# Patient Record
Sex: Male | Born: 1995 | Race: White | Hispanic: No | Marital: Single
Health system: Southern US, Community
[De-identification: ages and names within clinical notes are randomized; demographics above are authoritative.]

---
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
## 2015-02-19 ENCOUNTER — Emergency Department: Payer: BLUE CROSS/BLUE SHIELD

## 2015-02-19 ENCOUNTER — Emergency Department
Admission: EM | Admit: 2015-02-19 | Discharge: 2015-02-19 | Disposition: A | Discharge status: Home / Self Care | Payer: BLUE CROSS/BLUE SHIELD | Attending: Emergency Medicine | Admitting: Emergency Medicine

## 2015-02-19 DIAGNOSIS — N50811 Right testicular pain: Secondary | ICD-10-CM | POA: Insufficient documentation

## 2015-02-19 DIAGNOSIS — Z79899 Other long term (current) drug therapy: Secondary | ICD-10-CM | POA: Insufficient documentation

## 2015-02-19 DIAGNOSIS — N50819 Testicular pain, unspecified: Secondary | ICD-10-CM

## 2015-02-19 LAB — URINALYSIS COMPLETE WITH MICROSCOPIC (ARMC ONLY)
Bacteria, UA: NONE SEEN
Bilirubin Urine: NEGATIVE
GLUCOSE, UA: NEGATIVE mg/dL
HGB URINE DIPSTICK: NEGATIVE
Ketones, ur: NEGATIVE mg/dL
LEUKOCYTES UA: NEGATIVE
Nitrite: NEGATIVE
PH: 7 (ref 5.0–8.0)
Protein, ur: 30 mg/dL — AB
SPECIFIC GRAVITY, URINE: 1.021 (ref 1.005–1.030)
SQUAMOUS EPITHELIAL / LPF: NONE SEEN

## 2015-02-19 LAB — COMPREHENSIVE METABOLIC PANEL
ALBUMIN: 4.8 g/dL (ref 3.5–5.0)
ALT: 18 U/L (ref 17–63)
ANION GAP: 12 (ref 5–15)
AST: 12 U/L — AB (ref 15–41)
Alkaline Phosphatase: 74 U/L (ref 38–126)
BILIRUBIN TOTAL: 1.1 mg/dL (ref 0.3–1.2)
BUN: 12 mg/dL (ref 6–20)
CHLORIDE: 104 mmol/L (ref 101–111)
CO2: 24 mmol/L (ref 22–32)
Calcium: 9.6 mg/dL (ref 8.9–10.3)
Creatinine, Ser: 1.35 mg/dL — ABNORMAL HIGH (ref 0.61–1.24)
GFR calc Af Amer: >60 mL/min (ref >60)
GFR calc non Af Amer: >60 mL/min (ref >60)
GLUCOSE: 119 mg/dL — AB (ref 65–99)
POTASSIUM: 3.8 mmol/L (ref 3.5–5.1)
SODIUM: 140 mmol/L (ref 135–145)
TOTAL PROTEIN: 7.9 g/dL (ref 6.5–8.1)

## 2015-02-19 LAB — CBC
HCT: 45.5 % (ref 40.0–52.0)
Hemoglobin: 15.5 g/dL (ref 13.0–18.0)
MCH: 33.2 pg (ref 26.0–34.0)
MCHC: 34 g/dL (ref 32.0–36.0)
MCV: 97.6 fL (ref 80.0–100.0)
PLATELETS: 323 10*3/uL (ref 150–440)
RBC: 4.66 MIL/uL (ref 4.40–5.90)
RDW: 13 % (ref 11.5–14.5)
WBC: 11 10*3/uL — AB (ref 3.8–10.6)

## 2015-02-19 MED ORDER — OXYCODONE-ACETAMINOPHEN 5-325 MG PO TABS: 1.0000 | ORAL_TABLET | Freq: Four times a day (QID) | ORAL | Status: AC | PRN

## 2015-02-19 MED ORDER — OXYCODONE-ACETAMINOPHEN 5-325 MG PO TABS
1.0000 | ORAL_TABLET | Freq: Once | ORAL | Status: AC
  Administered 2015-02-19: 1 via ORAL
  Filled 2015-02-19: qty 1

## 2015-02-19 MED ORDER — MORPHINE SULFATE (PF) 4 MG/ML IV SOLN
INTRAVENOUS | Status: AC
  Administered 2015-02-19: 4 mg via INTRAVENOUS
  Filled 2015-02-19: qty 1

## 2015-02-19 MED ORDER — IOHEXOL 240 MG/ML SOLN
25.0000 mL | INTRAMUSCULAR | Status: AC
  Administered 2015-02-19: 50 mL via ORAL

## 2015-02-19 MED ORDER — IBUPROFEN 800 MG PO TABS: 800.0000 mg | ORAL_TABLET | Freq: Three times a day (TID) | ORAL | Status: AC | PRN

## 2015-02-19 MED ORDER — KETOROLAC TROMETHAMINE 30 MG/ML IJ SOLN
30.0000 mg | Freq: Once | INTRAMUSCULAR | Status: AC
  Administered 2015-02-19: 30 mg via INTRAVENOUS
  Filled 2015-02-19: qty 1

## 2015-02-19 MED ORDER — ONDANSETRON HCL 4 MG/2ML IJ SOLN
4.0000 mg | Freq: Once | INTRAMUSCULAR | Status: AC
  Administered 2015-02-19: 4 mg via INTRAVENOUS

## 2015-02-19 MED ORDER — ONDANSETRON HCL 4 MG/2ML IJ SOLN
INTRAMUSCULAR | Status: AC
  Administered 2015-02-19: 4 mg via INTRAVENOUS
  Filled 2015-02-19: qty 2

## 2015-02-19 MED ORDER — HYDROMORPHONE HCL 1 MG/ML IJ SOLN
INTRAMUSCULAR | Status: AC
  Administered 2015-02-19: 1 mg via INTRAVENOUS
  Filled 2015-02-19: qty 1

## 2015-02-19 MED ORDER — HYDROMORPHONE HCL 1 MG/ML IJ SOLN
1.0000 mg | Freq: Once | INTRAMUSCULAR | Status: AC
  Administered 2015-02-19: 1 mg via INTRAVENOUS

## 2015-02-19 MED ORDER — SODIUM CHLORIDE 0.9 % IV BOLUS (SEPSIS)
1000.0000 mL | Freq: Once | INTRAVENOUS | Status: AC
  Administered 2015-02-19: 1000 mL via INTRAVENOUS

## 2015-02-19 MED ORDER — MORPHINE SULFATE (PF) 4 MG/ML IV SOLN
4.0000 mg | Freq: Once | INTRAVENOUS | Status: AC
  Administered 2015-02-19: 4 mg via INTRAVENOUS

## 2015-02-19 MED ORDER — IOHEXOL 350 MG/ML SOLN
85.0000 mL | Freq: Once | INTRAVENOUS | Status: AC | PRN
  Administered 2015-02-19: 85 mL via INTRAVENOUS

## 2015-02-19 NOTE — ED Notes (Signed)
Patient transported to CT 

## 2015-02-19 NOTE — ED Notes (Signed)
Pt verbalized understanding of discharge instructions. NAD at this time. 

## 2015-02-19 NOTE — ED Notes (Signed)
Pt went to ultrasound.

## 2015-02-19 NOTE — ED Provider Notes (Signed)
[facility] Emergency Department Provider Note  ____________________________________________  Time seen: Approximately 4:04 AM  I have reviewed the triage vital signs and the nursing notes.   HISTORY  Chief Complaint Testicle Pain    HPI Lance Chan is a 19 y.o. male who comes in with right-sided testicular pain. The patient reports he started feeling some pressure in his testicle around 8:30. He reports that the pressure continued to grow. He had some small pain around midnight when he went to bed and reports he woke up and the pain was worse. The patient reports that he's never had these symptoms before. He's not had any nausea or vomiting and no fevers. The patient is in some intense pain so he decided to come in for evaluation.Patient rates his pain a 10 out of 10 in intensity. Denies any discharge and reports that he is on medication for a staph   No past medical history on file.  There are no active problems to display for this patient.   No past surgical history on file.  Current Outpatient Rx  Name  Route  Sig  Dispense  Refill  . lisdexamfetamine (VYVANSE) 30 MG capsule   Oral   Take 30 mg by mouth daily.           Allergies Review of patient's allergies indicates no known allergies.  No family history on file.  Social History Social History  Substance Use Topics  . Smoking status: Not on file  . Smokeless tobacco: Not on file  . Alcohol Use: Not on file    Review of Systems Constitutional: No fever/chills Eyes: No visual changes. ENT: No sore throat. Cardiovascular: Denies chest pain. Respiratory: Denies shortness of breath. Gastrointestinal: No abdominal pain.  No nausea, no vomiting.  No diarrhea.  No constipation. Genitourinary: Right testicle pain Musculoskeletal: Negative for back pain. Skin: Negative for rash. Neurological: Negative for headaches, focal weakness or numbness.  10-point ROS otherwise  negative.  ____________________________________________   PHYSICAL EXAM:  VITAL SIGNS: ED Triage Vitals  Enc Vitals Group     BP 02/19/15 0400 129/72 mmHg     Pulse Rate 02/19/15 0400 138     Resp 02/19/15 0400 18     Temp 02/19/15 0400 98.7 F (37.1 C)     Temp Source 02/19/15 0400 Oral     SpO2 02/19/15 0400 97 %     Weight 02/19/15 0400 200 lb (90.719 kg)     Height 02/19/15 0400  (1.854 m)     Head Cir --      Peak Flow --      Pain Score 02/19/15 0401 10     Pain Loc --      Pain Edu? --      Excl. in GC? --     Constitutional: Alert and oriented. Well appearing and in moderate distress. Eyes: Conjunctivae are normal. PERRL. EOMI. Head: Atraumatic. Nose: No congestion/rhinnorhea. Mouth/Throat: Mucous membranes are moist.  Oropharynx non-erythematous. Cardiovascular: The cardiac, regular rhythm. Grossly normal heart sounds.  Good peripheral circulation. Respiratory: Normal respiratory effort.  No retractions. Lungs CTAB. Gastrointestinal: Soft and nontender. No distention. Positive bowel sounds  Genitourinary: Right testicle tenderness to palpation with no significant swelling Musculoskeletal: No lower extremity tenderness nor edema.   Neurologic:  Normal speech and language.  Skin:  Skin is warm, dry and intact.  Psychiatric: Mood and affect are normal.   ____________________________________________   LABS (all labs ordered are listed, but only abnormal  results are displayed)  Labs Reviewed  CBC - Abnormal; Notable for the following:    WBC 11.0 (*)    All other components within normal limits  COMPREHENSIVE METABOLIC PANEL - Abnormal; Notable for the following:    Glucose, Bld 119 (*)    Creatinine, Ser 1.35 (*)    AST 12 (*)    All other components within normal limits  URINALYSIS COMPLETEWITH MICROSCOPIC ([facility] ONLY)   ____________________________________________  EKG  None ____________________________________________  RADIOLOGY  Testicle  ultrasound: No evidence of testicular torsion, testes unremarkable in appearance, small right epididymal head cyst noted ____________________________________________   PROCEDURES  Procedure(s) performed: None  Critical Care performed: No  ____________________________________________   INITIAL IMPRESSION / ASSESSMENT AND PLAN / ED COURSE  Pertinent labs & imaging results that were available during my care of the patient were reviewed by me and considered in my medical decision making (see chart for details).  The patient did receive a dose of morphine as well as a dose of Dilaudid for his pain. The patient's pain was improved for some time but it returned around 8 AM. Since the patient's ultrasound is unremarkable and he reports that he is going to get attention for the next month and a half we will do a CT of his pelvis to determine if there is a cause for his pain. I will give the patient a dose of Toradol and the patient's care was signed out to Dr. Sharma CovertNorman who will follow-up the results of his CT. ____________________________________________   FINAL CLINICAL IMPRESSION(S) / ED DIAGNOSES  Final diagnoses:  Testicular pain      Rebecka ApleyAllison P Gracious Renken, MD 02/19/15 [phone]

## 2015-02-19 NOTE — ED Notes (Signed)
Pt in with acute onset of right testicular pain and swelling, no injury.

## 2015-02-19 NOTE — ED Notes (Signed)
Ultrasound tech called for the Pt to receive pain medication since the Pt is in excruciating pain. Dr. Zenda AlpersWebster ordered more pain medication for the Pt.

## 2015-02-19 NOTE — ED Notes (Signed)
Pt returned from ultrasound

## 2015-02-19 NOTE — ED Notes (Signed)
Resumed care from [facility], [place]. Introduced Hydrologistself and Mary, RN to pt.

## 2015-02-19 NOTE — Discharge Instructions (Signed)
Please return to the emergency department if he develops severe pain, fever, inability to keep down fluids, or any other symptoms concerning to you.

## 2015-05-22 ENCOUNTER — Other Ambulatory Visit: Payer: Self-pay | Admitting: Family Medicine

## 2015-05-22 DIAGNOSIS — N50811 Right testicular pain: Secondary | ICD-10-CM

## 2015-06-03 ENCOUNTER — Ambulatory Visit
Admission: RE | Admit: 2015-06-03 | Discharge: 2015-06-03 | Disposition: A | Discharge status: Home / Self Care | Payer: BLUE CROSS/BLUE SHIELD | Source: Ambulatory Visit | Attending: Family Medicine | Admitting: Family Medicine

## 2015-06-03 DIAGNOSIS — N50811 Right testicular pain: Secondary | ICD-10-CM

## 2015-06-03 DIAGNOSIS — N433 Hydrocele, unspecified: Secondary | ICD-10-CM | POA: Insufficient documentation

## 2015-06-03 DIAGNOSIS — N503 Cyst of epididymis: Secondary | ICD-10-CM | POA: Insufficient documentation

## 2015-12-25 IMAGING — US US ART/VEN ABD/PELV/SCROTUM DOPPLER LTD
1 series · 14 of 25 positions shown · non-contrast
Comparison: None.

CLINICAL DATA: Acute onset of right testicular pain. Initial
encounter.

EXAM:
SCROTAL ULTRASOUND
DOPPLER ULTRASOUND OF THE TESTICLES
TECHNIQUE: Complete ultrasound examination of the testicles, epididymis, and
other scrotal structures was performed. Color and spectral Doppler
ultrasound were also utilized to evaluate blood flow to the
testicles.

[Series 1: us art/ven abd/pelv/scrotum doppler ltd · 0.07mm/px · 14 of 67 slices shown]
[im 1/67]
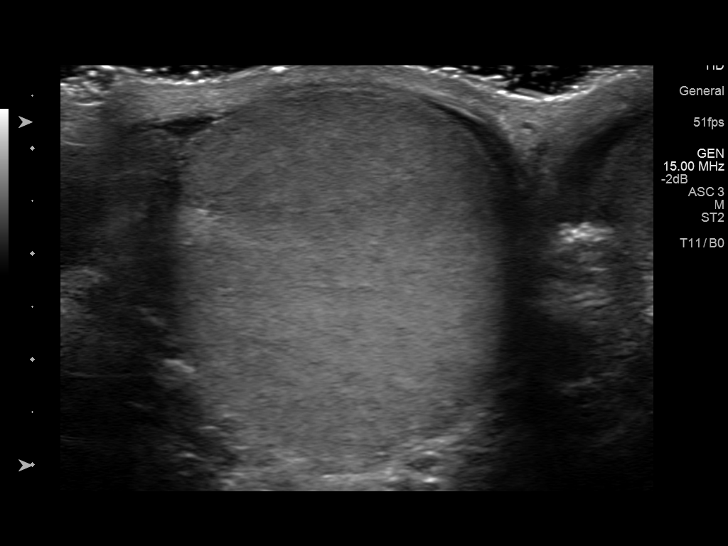
[im 6/67]
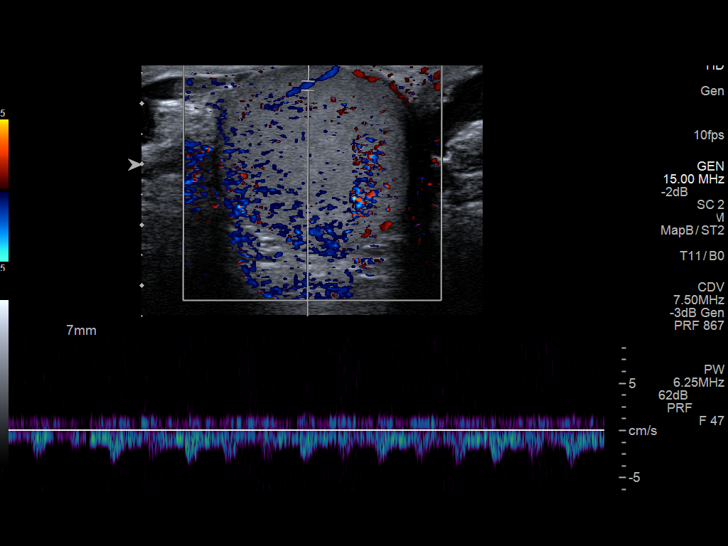
[im 12/67]
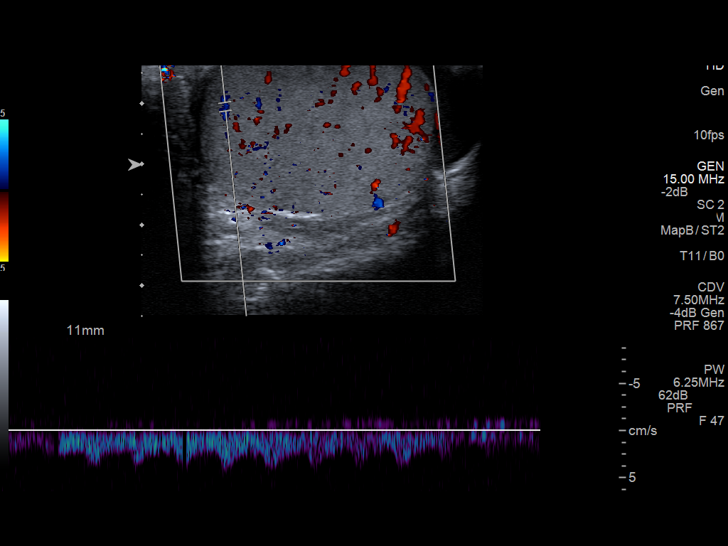
[im 17/67]
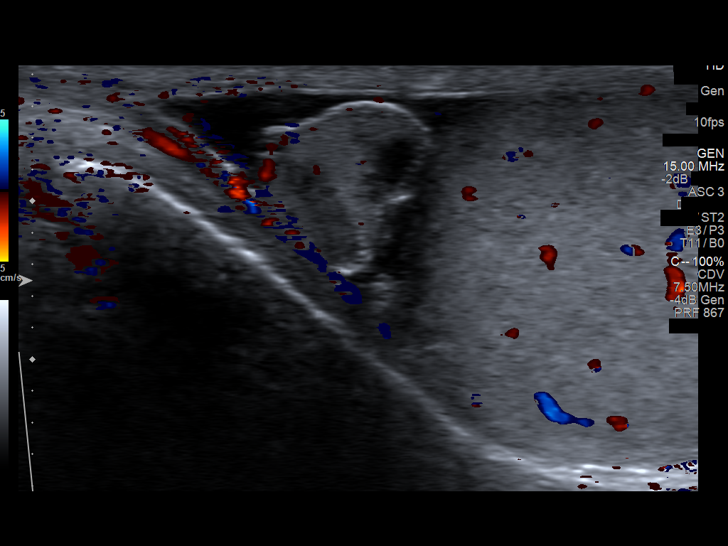
[im 23/67]
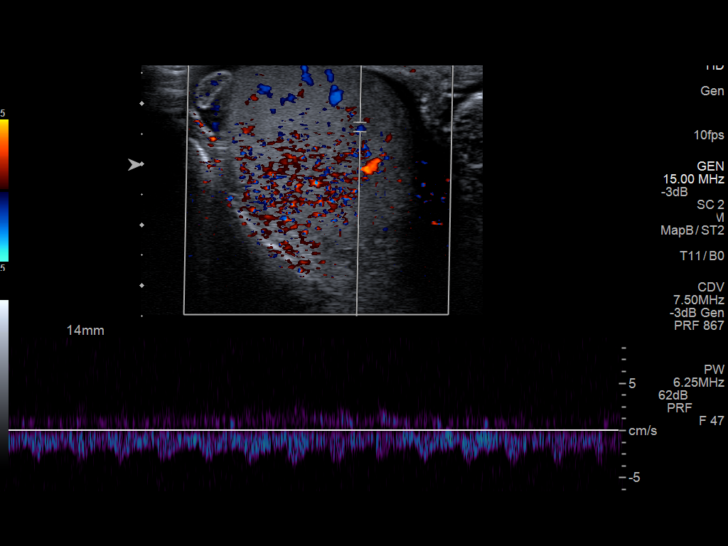
[im 25/67]
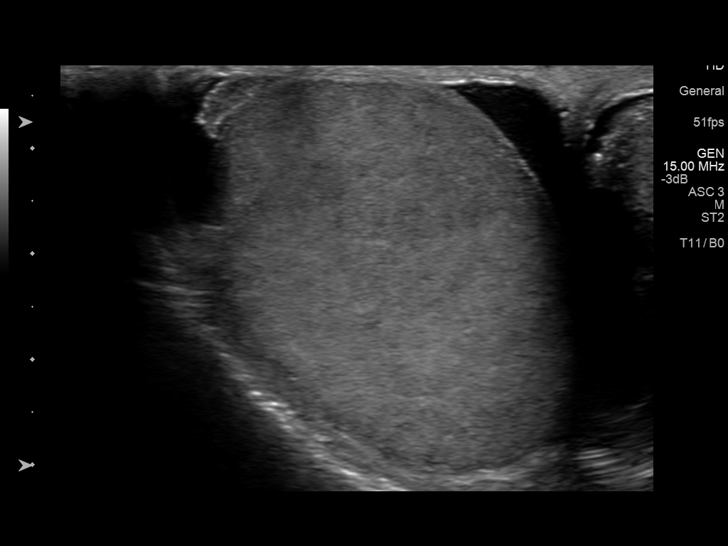
[im 31/67]
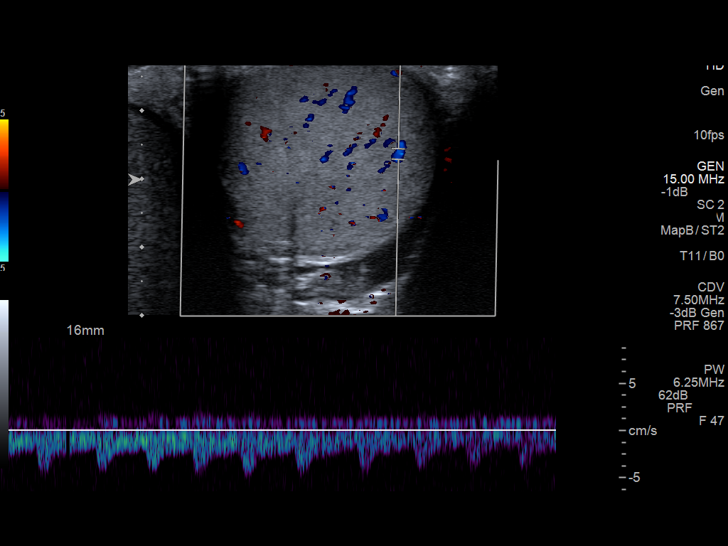
[im 36/67]
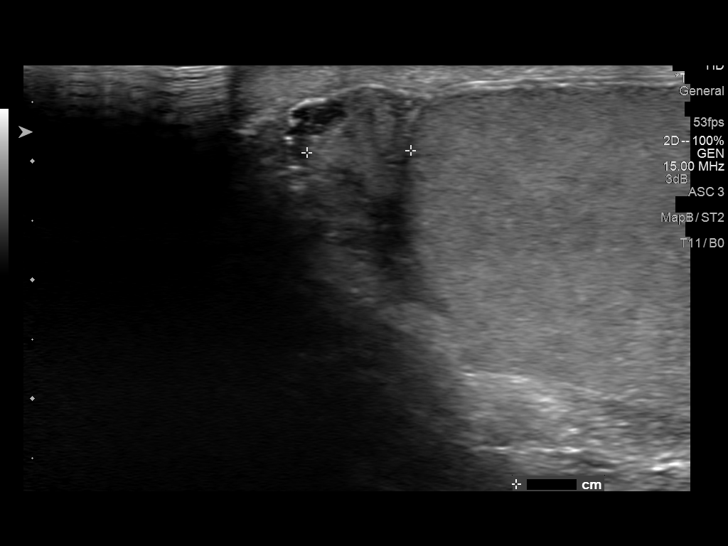
[im 42/67]
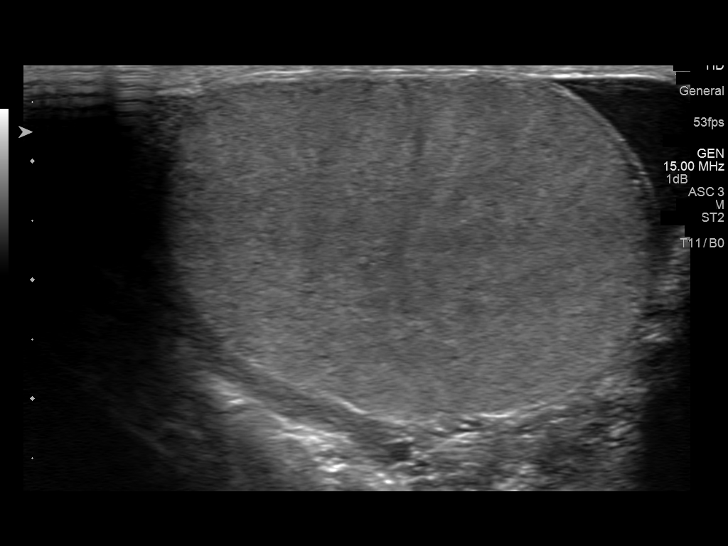
[im 45/67]
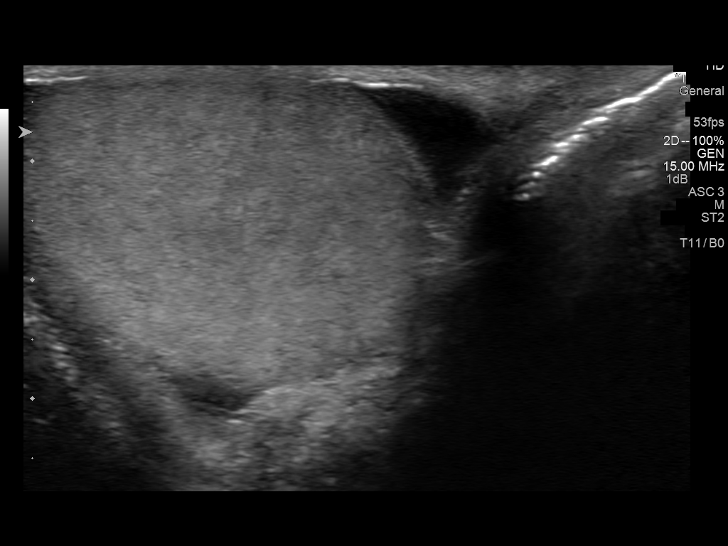
[im 50/67]
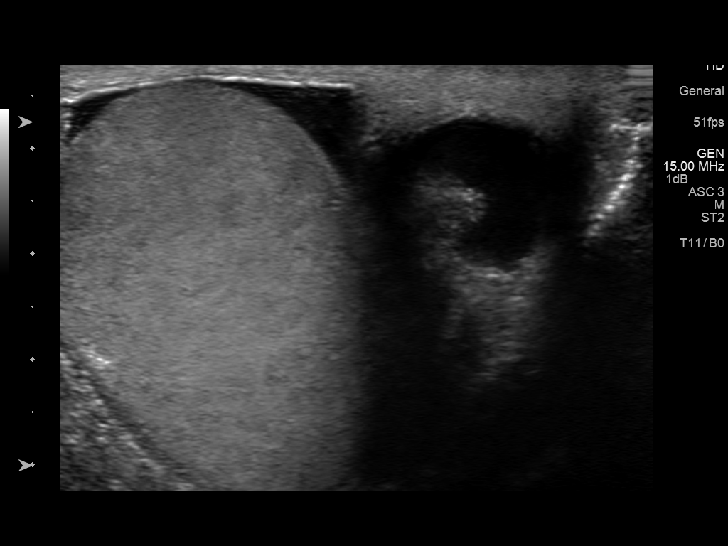
[im 56/67]
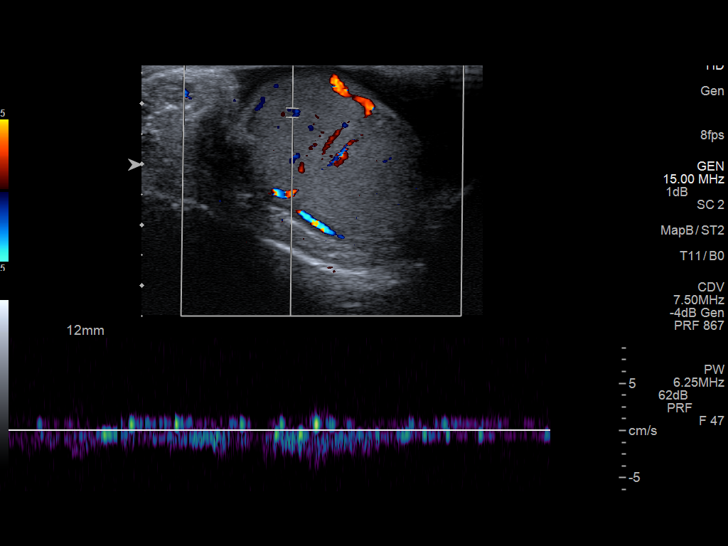
[im 61/67]
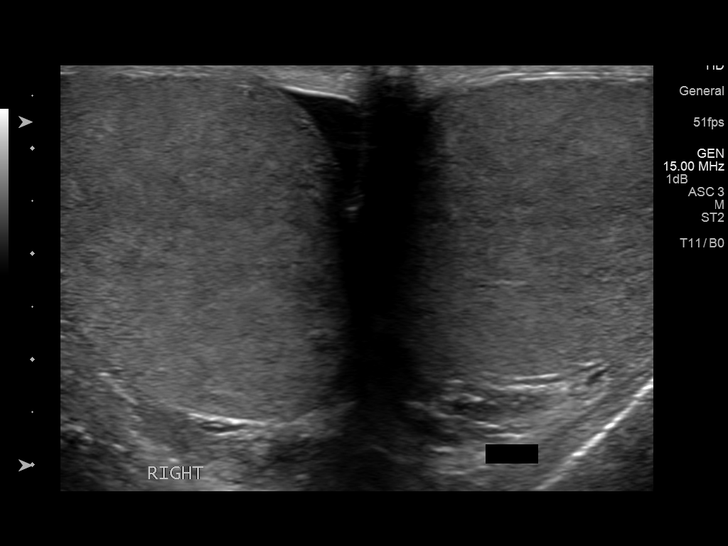
[im 67/67]
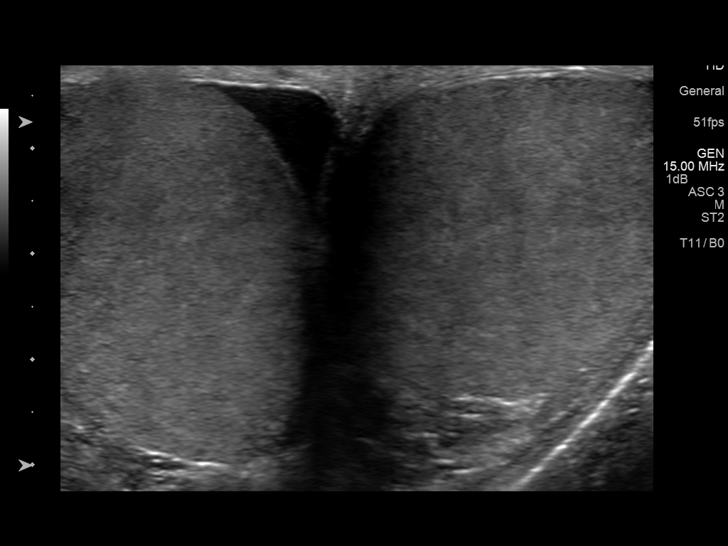

[14 of 25 positions shown; findings below may reference images not displayed]

FINDINGS: Right testicle

Measurements: 3.9 x 3.2 x 3.2 cm. No mass or microlithiasis
visualized.

Left testicle

Measurements: 4.1 x 2.8 x 3.2 cm. No mass or microlithiasis
visualized.

Right epididymis: A small 0.3 cm cyst is noted at the epididymal
head.

Left epididymis:  Normal in size and appearance.

Hydrocele:  Trace bilateral hydroceles remain within normal limits.

Varicocele:  None visualized.

Pulsed Doppler interrogation of both testes demonstrates normal low
resistance arterial and venous waveforms bilaterally.
IMPRESSION: 1. No evidence of testicular torsion. Testes unremarkable in
appearance.
2. Small right epididymal head cyst noted.

## 2016-11-07 IMAGING — CT CT PELVIS W/ CM
2 of 3 series · 17 of 46 positions shown, 19 images · IV contrast (omnipaque)
Comparison: Ultrasound of same day.

CLINICAL DATA: Right testicular pain.

EXAM:
CT PELVIS WITH CONTRAST
TECHNIQUE: Multidetector CT imaging of the pelvis was performed using the
standard protocol following the bolus administration of intravenous
contrast.
CONTRAST:  85mL OMNIPAQUE IOHEXOL 350 MG/ML SOLN

[Series 2: routine pel with · axial · 0.75mm/px · z∈[-1099,-794]mm · 14 of 71 slices shown, 16 images]
[im 5/71  soft-tissue]
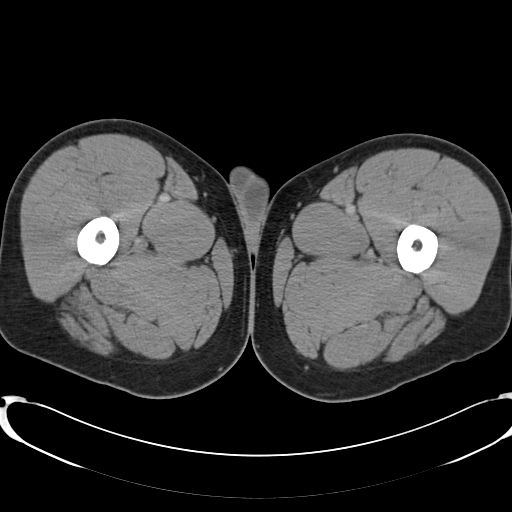
[im 5/71  bone]
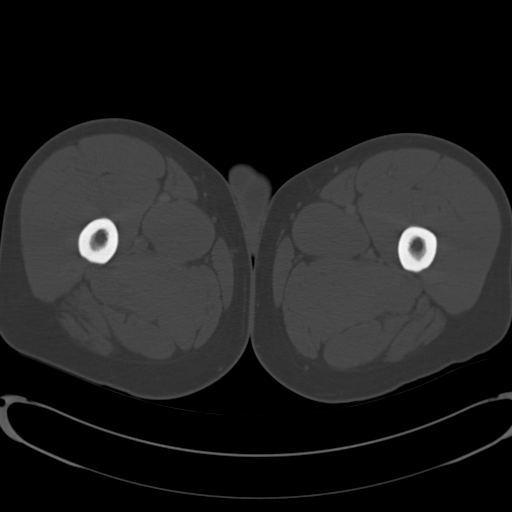
[im 10/71  soft-tissue]
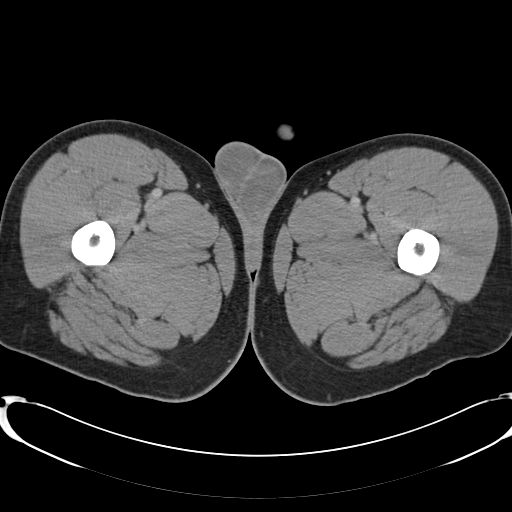
[im 14/71  soft-tissue]
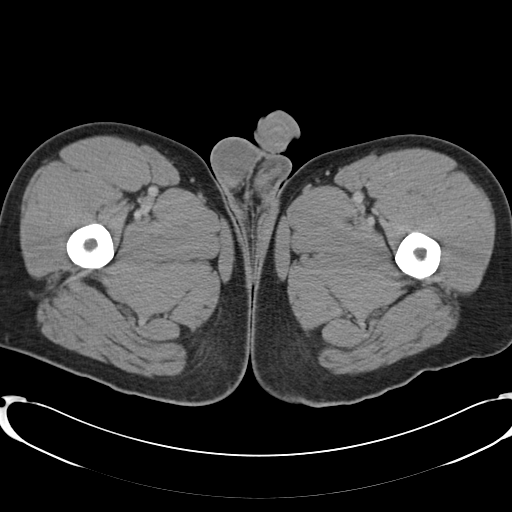
[im 19/71  soft-tissue]
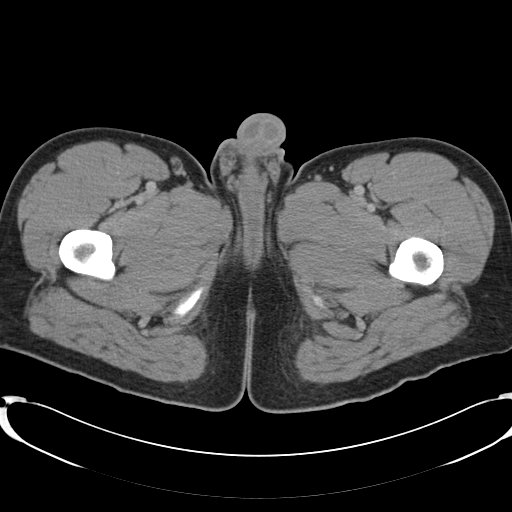
[im 23/71  soft-tissue]
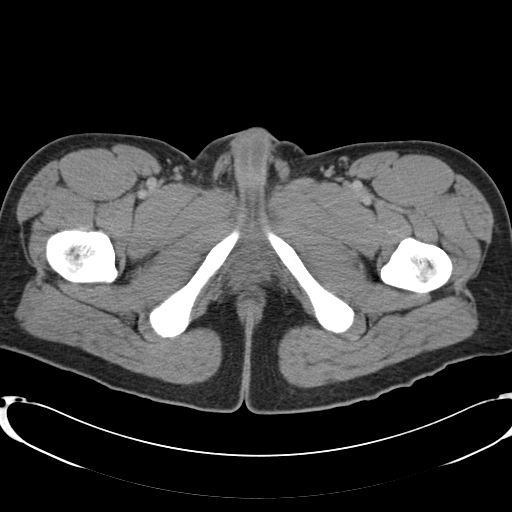
[im 28/71  soft-tissue]
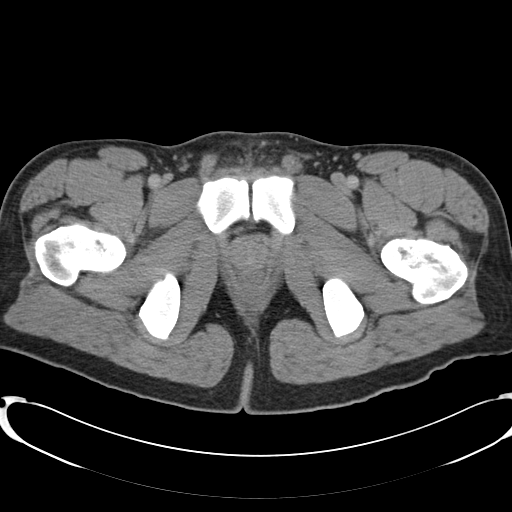
[im 32/71  soft-tissue]
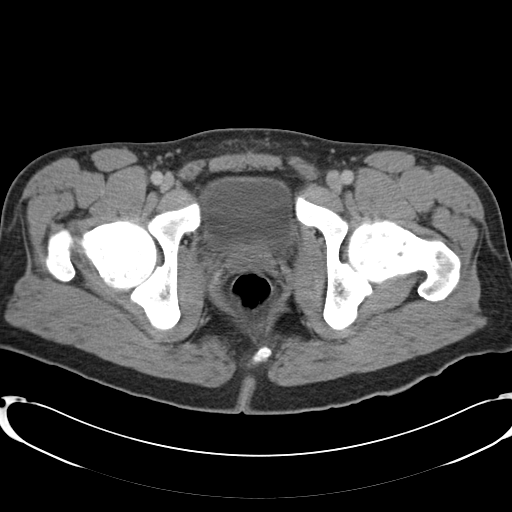
[im 39/71  soft-tissue]
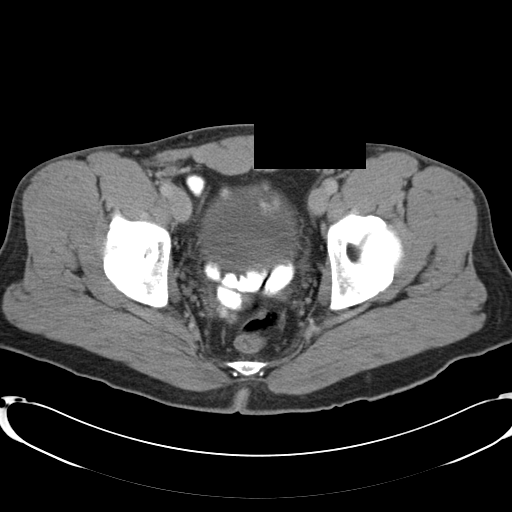
[im 43/71  soft-tissue]
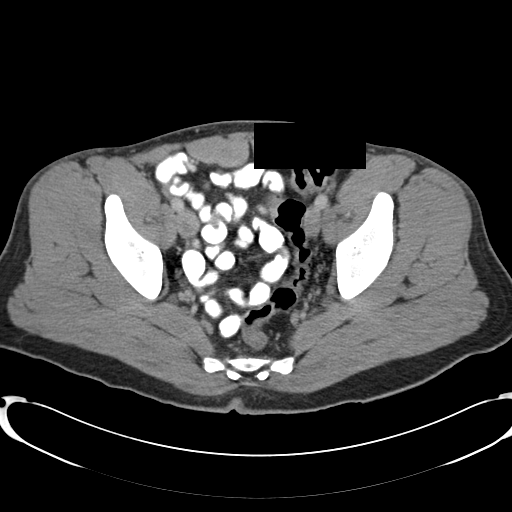
[im 43/71  bone]
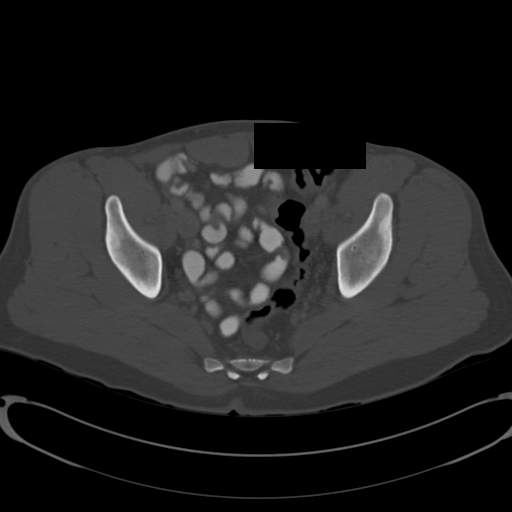
[im 48/71  soft-tissue]
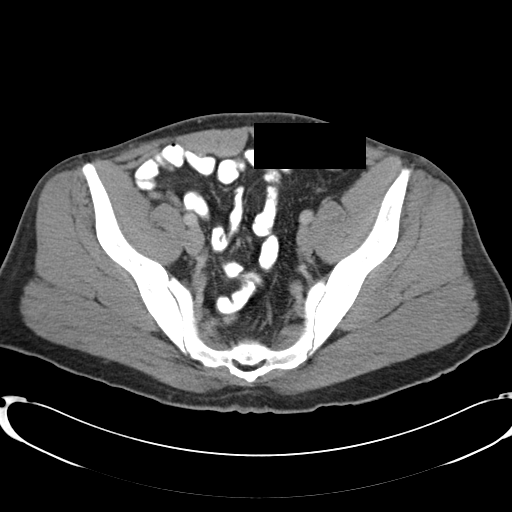
[im 52/71  soft-tissue]
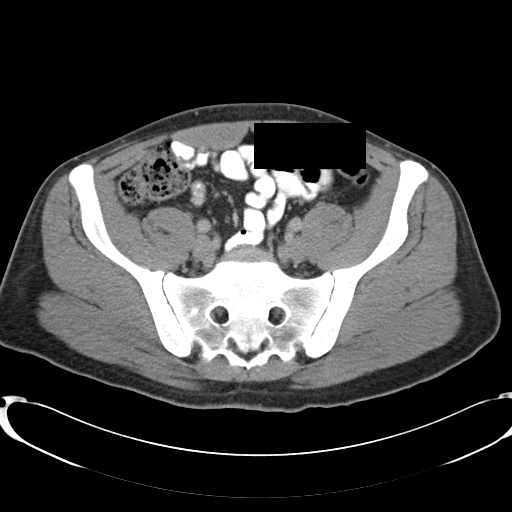
[im 57/71  soft-tissue]
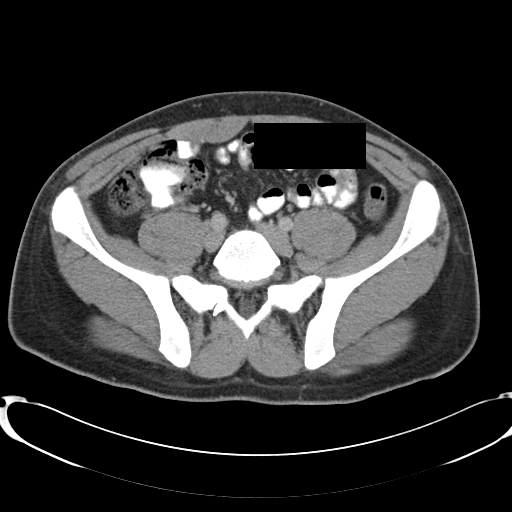
[im 61/71  soft-tissue]
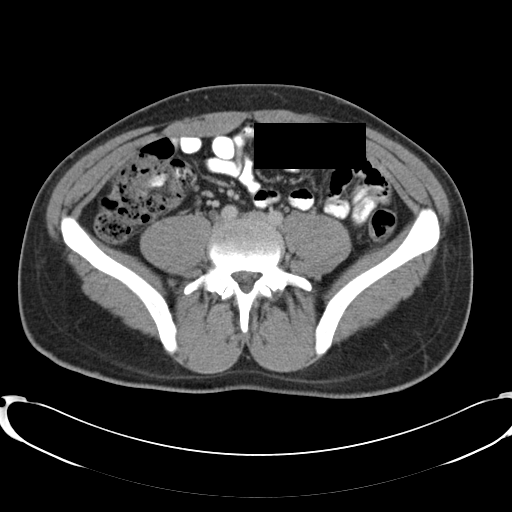
[im 66/71  soft-tissue]
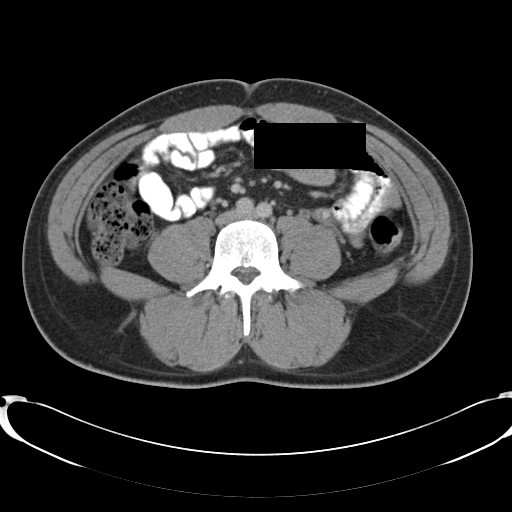

[Series 4: cor routine pel with · coronal · 0.68mm/px · 3 of 119 slices shown]
[im 40/119  soft-tissue]
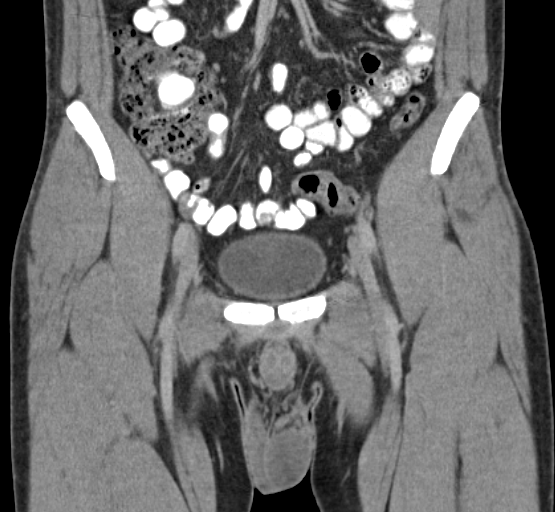
[im 53/119  soft-tissue]
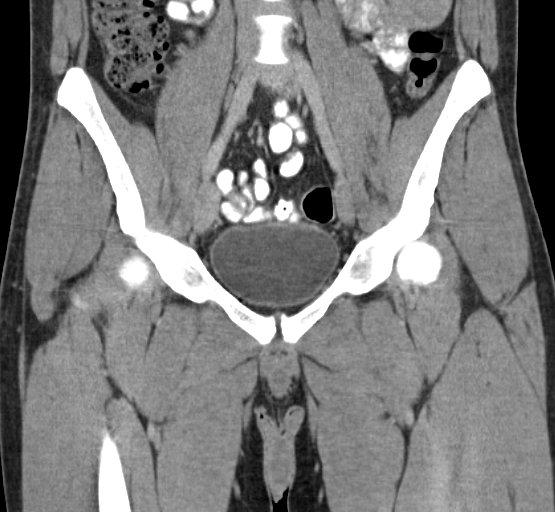
[im 66/119  soft-tissue]
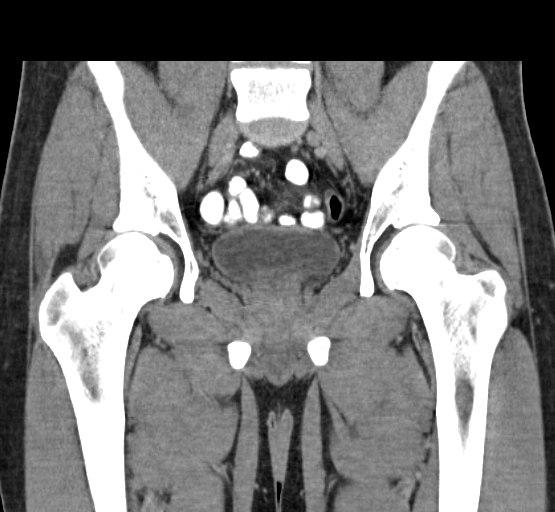

[17 of 46 positions shown; findings below may reference images not displayed]

FINDINGS: No significant osseous abnormality is noted. There is no evidence of
bowel obstruction. The appendix appears normal. No abnormal fluid
collection is noted. Urinary bladder appears normal. There is no
evidence of inguinal or ventral hernia. No significant adenopathy is
noted.
IMPRESSION: No definite abnormality seen in the pelvis.

## 2023-01-07 ENCOUNTER — Other Ambulatory Visit (HOSPITAL_COMMUNITY): Payer: Self-pay
